# Patient Record
Sex: Female | Born: 1960 | Race: Black or African American | Hispanic: No | Marital: Married | Smoking: Former smoker
Health system: Southern US, Community
[De-identification: ages and names within clinical notes are randomized; demographics above are authoritative.]

## PROBLEM LIST (undated history)

## (undated) DIAGNOSIS — R030 Elevated blood-pressure reading, without diagnosis of hypertension: Secondary | ICD-10-CM

## (undated) DIAGNOSIS — T7840XA Allergy, unspecified, initial encounter: Secondary | ICD-10-CM

## (undated) HISTORY — DX: Elevated blood-pressure reading, without diagnosis of hypertension: R03.0

## (undated) HISTORY — DX: Allergy, unspecified, initial encounter: T78.40XA

---

## 2007-01-13 ENCOUNTER — Ambulatory Visit: Payer: Self-pay | Admitting: Internal Medicine

## 2007-01-13 LAB — CONVERTED CEMR LAB
AST: 43 units/L — ABNORMAL HIGH (ref 0–37)
Albumin: 3.9 g/dL (ref 3.5–5.2)
Basophils Absolute: 0 10*3/uL (ref 0.0–0.1)
Bilirubin, Direct: 0.2 mg/dL (ref 0.0–0.3)
Chloride: 105 meq/L (ref 96–112)
Cholesterol: 148 mg/dL (ref 0–200)
Eosinophils Absolute: 0 10*3/uL (ref 0.0–0.6)
Eosinophils Relative: 1.4 % (ref 0.0–5.0)
GFR calc non Af Amer: 72 mL/min
Glucose, Bld: 82 mg/dL (ref 70–99)
HCT: 38.1 % (ref 36.0–46.0)
HDL: 74.6 mg/dL (ref >39.0)
Hemoglobin, Urine: NEGATIVE
Hemoglobin: 13.1 g/dL (ref 12.0–15.0)
LDL Cholesterol: 69 mg/dL (ref 0–99)
Leukocytes, UA: NEGATIVE
Lymphocytes Relative: 34.5 % (ref 12.0–46.0)
MCHC: 34.3 g/dL (ref 30.0–36.0)
MCV: 88.5 fL (ref 78.0–100.0)
Monocytes Absolute: 0.3 10*3/uL (ref 0.2–0.7)
Neutrophils Relative %: 54.4 % (ref 43.0–77.0)
Nitrite: NEGATIVE
Potassium: 4.6 meq/L (ref 3.5–5.1)
Sodium: 140 meq/L (ref 135–145)
TSH: 0.89 microintl units/mL (ref 0.35–5.50)
Total Bilirubin: 1.5 mg/dL — ABNORMAL HIGH (ref 0.3–1.2)
Urobilinogen, UA: 0.2 (ref 0.0–1.0)
WBC: 3.2 10*3/uL — ABNORMAL LOW (ref 4.5–10.5)

## 2007-01-18 ENCOUNTER — Ambulatory Visit: Payer: Self-pay | Admitting: Internal Medicine

## 2008-09-13 ENCOUNTER — Encounter: Payer: Self-pay | Admitting: Family Medicine

## 2009-11-09 HISTORY — PX: BREAST ENHANCEMENT SURGERY: SHX7

## 2010-03-07 ENCOUNTER — Encounter: Admission: RE | Admit: 2010-03-07 | Discharge: 2010-03-07 | Payer: Self-pay | Admitting: Family Medicine

## 2010-09-15 ENCOUNTER — Ambulatory Visit (HOSPITAL_COMMUNITY): Admission: RE | Admit: 2010-09-15 | Discharge: 2010-09-15 | Payer: Self-pay | Admitting: Obstetrics and Gynecology

## 2011-02-26 ENCOUNTER — Emergency Department (HOSPITAL_COMMUNITY)
Admission: EM | Admit: 2011-02-26 | Discharge: 2011-02-26 | Disposition: A | Discharge status: Home / Self Care | Payer: No Typology Code available for payment source | Attending: Emergency Medicine | Admitting: Emergency Medicine

## 2011-02-26 ENCOUNTER — Emergency Department (HOSPITAL_COMMUNITY): Payer: No Typology Code available for payment source

## 2011-02-26 DIAGNOSIS — M542 Cervicalgia: Secondary | ICD-10-CM | POA: Insufficient documentation

## 2011-02-26 DIAGNOSIS — R079 Chest pain, unspecified: Secondary | ICD-10-CM | POA: Insufficient documentation

## 2011-02-26 DIAGNOSIS — M545 Low back pain, unspecified: Secondary | ICD-10-CM | POA: Insufficient documentation

## 2011-02-26 DIAGNOSIS — T148XXA Other injury of unspecified body region, initial encounter: Secondary | ICD-10-CM | POA: Insufficient documentation

## 2011-02-26 DIAGNOSIS — Y92410 Unspecified street and highway as the place of occurrence of the external cause: Secondary | ICD-10-CM | POA: Insufficient documentation

## 2011-02-26 DIAGNOSIS — R51 Headache: Secondary | ICD-10-CM | POA: Insufficient documentation

## 2011-02-26 DIAGNOSIS — M25519 Pain in unspecified shoulder: Secondary | ICD-10-CM | POA: Insufficient documentation

## 2011-07-27 ENCOUNTER — Telehealth: Payer: Self-pay | Admitting: Internal Medicine

## 2011-07-27 ENCOUNTER — Other Ambulatory Visit: Payer: Self-pay | Admitting: Internal Medicine

## 2011-07-27 DIAGNOSIS — Z1231 Encounter for screening mammogram for malignant neoplasm of breast: Secondary | ICD-10-CM

## 2011-07-27 NOTE — Telephone Encounter (Signed)
Pt stated she has seen dr Lovell Sheehan in past would like to be re-est.

## 2011-07-28 ENCOUNTER — Telehealth: Payer: Self-pay | Admitting: *Deleted

## 2011-07-28 NOTE — Telephone Encounter (Signed)
Not taking any new pt at present-but there other mds in office that will be glad to see her

## 2011-07-30 NOTE — Telephone Encounter (Signed)
Not taking new pt, please ask other md in office

## 2011-07-30 NOTE — Telephone Encounter (Signed)
lmom for pt to call back

## 2011-08-05 NOTE — Telephone Encounter (Signed)
lmom doc jenkins not accepting new pt-other MD in building accepting new pt

## 2011-09-16 ENCOUNTER — Encounter: Payer: Self-pay | Admitting: Internal Medicine

## 2011-09-16 ENCOUNTER — Ambulatory Visit (INDEPENDENT_AMBULATORY_CARE_PROVIDER_SITE_OTHER): Payer: BC Managed Care – PPO | Admitting: Internal Medicine

## 2011-09-16 VITALS — BP 132/84 | HR 76 | Temp 98.7°F | Ht 67.5 in | Wt 143.0 lb

## 2011-09-16 DIAGNOSIS — Z0000 Encounter for general adult medical examination without abnormal findings: Secondary | ICD-10-CM | POA: Insufficient documentation

## 2011-09-16 NOTE — Patient Instructions (Signed)
Please monitor your blood pressure at home 2-3 x per week for next 1 month Call my nurse with your BP readings as directed. Please send [place] a copy of your next mammogram Let us know when you want to be up dated with Tdap and shingles vaccine.

## 2011-09-16 NOTE — Assessment & Plan Note (Signed)
Reviewed adult health maintenance protocols. Patient is followed by her GYN. She is due for her Pap and pelvic and is scheduled for mammogram tomorrow. I requested copy of mammogram. Patient will be referred for screening colonoscopy.  We discussed adult immunizations.  She will check her records for when she last received tetanus vaccine. She will consider zostavax.  Obtain screening labs.

## 2011-09-16 NOTE — Progress Notes (Signed)
  Subjective:    Patient ID: Holly Collins, female    DOB: 08-Sep-1961, 50 y.o.   MRN: [id number]  HPI  50 year old African American female to establish and for routine physical. Patient denies history of chronic medical illness. She reports history of elevated blood pressure readings in the past but has never been treated for hypertension. She is health conscious and exercises on a regular basis. Her hobby is bodybuilding.  She follows healthy diet.    She is followed by her GYN for Pap and Pelvic.  Her menstrual cycles are sporadic.  She denies frequent hot flashes.   Review of Systems  Constitutional: Negative for activity change, appetite change and unexpected weight change.  Eyes: Negative for visual disturbance.  Respiratory: Negative for cough, chest tightness and shortness of breath.   Cardiovascular: Negative for chest pain.  Genitourinary: Negative for difficulty urinating.  Neurological: Negative for headaches.  Gastrointestinal: Negative for abdominal pain, heartburn melena or hematochezia Psych: Negative for depression or anxiety  Past Medical History  Diagnosis Date  . Hypertension     History   Social History  . Marital Status: Married    Spouse Name: N/A    Number of Children: N/A  . Years of Education: N/A   Occupational History  . Not on file.   Social History Main Topics  . Smoking status: Former Smoker    Quit date: 09/15/1989  . Smokeless tobacco: Not on file  . Alcohol Use: No  . Drug Use: No  . Sexually Active: Not on file   Other Topics Concern  . Not on file   Social History Narrative  . No narrative on file    History reviewed. No pertinent past surgical history.  Family History  Problem Relation Age of Onset  . Diabetes Maternal Aunt   . Hypertension Maternal Aunt   . Cancer Maternal Grandmother     stomach  . Stroke Maternal Grandmother     Allergies not on file  No current outpatient prescriptions on file prior to visit.     BP 132/84  Pulse 76  Temp(Src) 98.7 F (37.1 C) (Oral)  Ht 5' 7.5" (1.715 m)  Wt 143 lb (64.864 kg)  BMI 22.07 kg/m2        Objective:   Physical Exam   Constitutional: Appears well-developed and well-nourished. No distress.  Head: Normocephalic and atraumatic.  Ear:  Right and left ear normal.  TMs clear.  Hearing is grossly normal Mouth/Throat: Oropharynx is clear and moist.  Eyes: Conjunctivae are normal. Pupils are equal, round, and reactive to light.  Neck: Normal range of motion. Neck supple. No thyromegaly present. No carotid bruit Cardiovascular: Normal rate, regular rhythm and normal heart sounds.  Exam reveals no gallop and no friction rub.  No murmur heard. Pulmonary/Chest: Effort normal and breath sounds normal.  No wheezes. No rales.  Abdominal: Soft. Bowel sounds are normal. No mass. There is no tenderness.  Neurological: Alert. No cranial nerve deficit.  Skin: Skin is warm and dry.  Psychiatric: Normal mood and affect. Behavior is normal.      Assessment & Plan:

## 2011-09-17 ENCOUNTER — Other Ambulatory Visit: Payer: No Typology Code available for payment source

## 2011-09-17 ENCOUNTER — Ambulatory Visit (HOSPITAL_COMMUNITY)
Admission: RE | Admit: 2011-09-17 | Discharge: 2011-09-17 | Disposition: A | Discharge status: Home / Self Care | Payer: BC Managed Care – PPO | Source: Ambulatory Visit | Attending: Internal Medicine | Admitting: Internal Medicine

## 2011-09-17 DIAGNOSIS — Z1231 Encounter for screening mammogram for malignant neoplasm of breast: Secondary | ICD-10-CM | POA: Insufficient documentation

## 2011-09-17 LAB — LIPID PANEL
HDL: 90.3 mg/dL (ref >39.00)
LDL Cholesterol: 59 mg/dL (ref 0–99)
Total CHOL/HDL Ratio: 2
Triglycerides: 58 mg/dL (ref 0.0–149.0)

## 2011-09-17 LAB — CBC WITH DIFFERENTIAL/PLATELET
Basophils Absolute: 0 10*3/uL (ref 0.0–0.1)
Eosinophils Absolute: 0.2 10*3/uL (ref 0.0–0.7)
Lymphocytes Relative: 34 % (ref 12.0–46.0)
MCHC: 34.3 g/dL (ref 30.0–36.0)
Monocytes Relative: 7.8 % (ref 3.0–12.0)
Neutro Abs: 1.6 10*3/uL (ref 1.4–7.7)
Neutrophils Relative %: 51.9 % (ref 43.0–77.0)
Platelets: 170 10*3/uL (ref 150.0–400.0)
RDW: 12.4 % (ref 11.5–14.6)

## 2011-09-17 LAB — POCT URINALYSIS DIPSTICK
Bilirubin, UA: NEGATIVE
Blood, UA: NEGATIVE
Glucose, UA: NEGATIVE
Leukocytes, UA: NEGATIVE
Nitrite, UA: NEGATIVE
Urobilinogen, UA: 0.2

## 2011-09-17 LAB — HEPATIC FUNCTION PANEL
Alkaline Phosphatase: 49 U/L (ref 39–117)
Bilirubin, Direct: 0 mg/dL (ref 0.0–0.3)
Total Bilirubin: 1.1 mg/dL (ref 0.3–1.2)

## 2011-09-17 LAB — BASIC METABOLIC PANEL
CO2: 33 mEq/L — ABNORMAL HIGH (ref 19–32)
Calcium: 9.3 mg/dL (ref 8.4–10.5)
Chloride: 103 mEq/L (ref 96–112)
Creatinine, Ser: 0.8 mg/dL (ref 0.4–1.2)
Sodium: 140 mEq/L (ref 135–145)

## 2011-09-17 LAB — TSH: TSH: 0.58 u[IU]/mL (ref 0.35–5.50)

## 2011-09-17 NOTE — Progress Notes (Signed)
Addended by: Bonnye Fava on: 09/17/2011 09:04 AM   Modules accepted: Orders

## 2011-09-18 ENCOUNTER — Encounter: Payer: Self-pay | Admitting: Internal Medicine

## 2011-09-21 ENCOUNTER — Encounter: Payer: Self-pay | Admitting: Internal Medicine

## 2011-10-21 ENCOUNTER — Encounter: Payer: Self-pay | Admitting: Gastroenterology

## 2011-11-16 ENCOUNTER — Encounter: Payer: Self-pay | Admitting: Gastroenterology

## 2011-12-02 ENCOUNTER — Other Ambulatory Visit: Payer: No Typology Code available for payment source | Admitting: Gastroenterology

## 2012-01-08 DIAGNOSIS — T7840XA Allergy, unspecified, initial encounter: Secondary | ICD-10-CM

## 2012-01-08 HISTORY — DX: Allergy, unspecified, initial encounter: T78.40XA

## 2012-08-16 ENCOUNTER — Other Ambulatory Visit: Payer: Self-pay | Admitting: Internal Medicine

## 2012-08-16 DIAGNOSIS — Z1231 Encounter for screening mammogram for malignant neoplasm of breast: Secondary | ICD-10-CM

## 2012-10-28 ENCOUNTER — Ambulatory Visit (HOSPITAL_COMMUNITY)
Admission: RE | Admit: 2012-10-28 | Discharge: 2012-10-28 | Disposition: A | Discharge status: Home / Self Care | Payer: BC Managed Care – PPO | Source: Ambulatory Visit | Attending: Internal Medicine | Admitting: Internal Medicine

## 2012-10-28 DIAGNOSIS — Z1231 Encounter for screening mammogram for malignant neoplasm of breast: Secondary | ICD-10-CM | POA: Insufficient documentation
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
## 2012-11-23 ENCOUNTER — Ambulatory Visit (INDEPENDENT_AMBULATORY_CARE_PROVIDER_SITE_OTHER): Payer: BC Managed Care – PPO | Admitting: Internal Medicine

## 2012-11-23 ENCOUNTER — Encounter: Payer: Self-pay | Admitting: Internal Medicine

## 2012-11-23 ENCOUNTER — Other Ambulatory Visit (HOSPITAL_COMMUNITY)
Admission: RE | Admit: 2012-11-23 | Discharge: 2012-11-23 | Disposition: A | Discharge status: Home / Self Care | Payer: BC Managed Care – PPO | Source: Ambulatory Visit | Attending: Internal Medicine | Admitting: Internal Medicine

## 2012-11-23 VITALS — BP 148/98 | HR 72 | Temp 98.1°F | Ht 68.0 in | Wt 150.0 lb

## 2012-11-23 DIAGNOSIS — Z23 Encounter for immunization: Secondary | ICD-10-CM

## 2012-11-23 DIAGNOSIS — Z01419 Encounter for gynecological examination (general) (routine) without abnormal findings: Secondary | ICD-10-CM | POA: Insufficient documentation

## 2012-11-23 DIAGNOSIS — I10 Essential (primary) hypertension: Secondary | ICD-10-CM

## 2012-11-23 DIAGNOSIS — Z0000 Encounter for general adult medical examination without abnormal findings: Secondary | ICD-10-CM

## 2012-11-23 LAB — HEPATIC FUNCTION PANEL
AST: 33 U/L (ref 0–37)
Albumin: 4.4 g/dL (ref 3.5–5.2)
Alkaline Phosphatase: 66 U/L (ref 39–117)
Total Protein: 7.3 g/dL (ref 6.0–8.3)

## 2012-11-23 LAB — CBC WITH DIFFERENTIAL/PLATELET
Basophils Absolute: 0.1 10*3/uL (ref 0.0–0.1)
Basophils Relative: 1.1 % (ref 0.0–3.0)
Eosinophils Absolute: 0.1 10*3/uL (ref 0.0–0.7)
Lymphocytes Relative: 23 % (ref 12.0–46.0)
MCHC: 34.1 g/dL (ref 30.0–36.0)
Monocytes Relative: 8.2 % (ref 3.0–12.0)
Neutrophils Relative %: 65.7 % (ref 43.0–77.0)
RBC: 4.49 Mil/uL (ref 3.87–5.11)

## 2012-11-23 LAB — LIPID PANEL
Cholesterol: 159 mg/dL (ref 0–200)
HDL: 90 mg/dL (ref >39.00)
LDL Cholesterol: 62 mg/dL (ref 0–99)
Triglycerides: 34 mg/dL (ref 0.0–149.0)

## 2012-11-23 LAB — POCT URINALYSIS DIPSTICK
Blood, UA: NEGATIVE
Nitrite, UA: NEGATIVE
Urobilinogen, UA: 0.2
pH, UA: 6.5

## 2012-11-23 MED ORDER — LOSARTAN POTASSIUM 50 MG PO TABS: 50.0000 mg | ORAL_TABLET | Freq: Every day | ORAL | Status: DC

## 2012-11-23 NOTE — Progress Notes (Signed)
Subjective:    Patient ID: DOAA KENDZIERSKI, female    DOB: [birth year], 52 y.o.   MRN: [id number]  HPI  52 year old [place] American female for followup and routine physical. She denies any significant interval medical history. She reports completing mammogram in December of 2013. Patient never followed up for colonoscopy last year.  Patient is due for annual Pap and pelvic. She denies history of abnormal Paps in the past. She denies any vaginal discharge. She has stopped menstruating. She has mild intermittent hot flashes.  Patient follows healthy diet and exercises regularly. She is a Pharmacist, community.  Review of Systems  Constitutional: Negative for activity change, appetite change and unexpected weight change.  Eyes: Negative for visual disturbance.  Respiratory: Negative for cough, chest tightness and shortness of breath.   Cardiovascular: Negative for chest pain.  Genitourinary: Negative for difficulty urinating.  Neurological: Negative for headaches.  Gastrointestinal: Negative for abdominal pain, heartburn melena or hematochezia Psych: Negative for depression or anxiety  Past Medical History  Diagnosis Date  . Elevated blood pressure reading without diagnosis of hypertension     History   Social History  . Marital Status: Married    Spouse Name: N/A    Number of Children: N/A  . Years of Education: N/A   Occupational History  . Customer service    Social History Main Topics  . Smoking status: Former Smoker    Quit date: 09/15/1989  . Smokeless tobacco: Not on file  . Alcohol Use: No  . Drug Use: No  . Sexually Active: Not on file   Other Topics Concern  . 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    No past surgical history on file.  Family History  Problem Relation Age of Onset  . Diabetes Maternal Aunt     . Hypertension Maternal Aunt   . Cancer Maternal Grandmother     stomach  . Stroke Maternal Grandmother     Not on File  Current Outpatient Prescriptions on File Prior to Visit  Medication Sig Dispense Refill  . losartan (COZAAR) 50 MG tablet Take 1 tablet (50 mg total) by mouth daily.  30 tablet  2    BP 148/98  Pulse 72  Temp 98.1 F (36.7 C) (Oral)  Ht 5\' 8"  (1.727 m)  Wt 150 lb (68.04 kg)  BMI 22.81 kg/m2  LMP 08/10/2011           Objective:   Physical Exam  Constitutional: She is oriented to person, place, and time. She appears well-developed and well-nourished. No distress.  HENT:  Head: Normocephalic.  Right Ear: External ear normal.  Left Ear: External ear normal.  Mouth/Throat: Oropharynx is clear and moist.  Eyes: EOM are normal. Pupils are equal, round, and reactive to light.  Neck: Neck supple. No thyromegaly present.       No carotid bruit  Cardiovascular: Normal rate, regular rhythm and normal heart sounds.   No murmur heard. Pulmonary/Chest: Effort normal and breath sounds normal. She has no wheezes.  Abdominal: Soft. Bowel sounds are normal. She exhibits no mass.  Genitourinary: Vagina normal and uterus normal. Guaiac negative stool. No vaginal discharge found.  Musculoskeletal: She exhibits no edema.  Lymphadenopathy:    She has no cervical adenopathy.  Neurological: She is alert and oriented to person, place, and time.  No cranial nerve deficit.  Skin: Skin is warm and dry.  Psychiatric: She has a normal mood and affect. Her behavior is normal.          Assessment & Plan:

## 2012-11-23 NOTE — Assessment & Plan Note (Addendum)
Reviewed adult health maintenance protocols.  Pap and pelvic exam performed. No abnormality in physical exam. Patient updated with tetanus vaccine. Refer for screening colonoscopy. She declines influenza vaccine.  Patient follows healthy diet and exercises regularly. She is a Pharmacist, community. 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

## 2012-11-23 NOTE — Patient Instructions (Addendum)
Please complete the following lab tests within 2 weeks of starting blood pressure medication: BMET - 401.9

## 2012-11-23 NOTE — Assessment & Plan Note (Signed)
Discussed risks of untreated hypertension. Patient reluctant to start medication. She woul like to monitor home blood pressure readings. Patient understands to start losartan 50 mg once daily if systolic blood pressures consistently above 140. She'll return in 2 weeks for electrolytes and kidney function after starting losartan.   ROV in 6 weeks. BP: 148/98 mmHg

## 2012-12-05 ENCOUNTER — Encounter: Payer: Self-pay | Admitting: Internal Medicine

## 2013-01-06 ENCOUNTER — Encounter: Payer: Self-pay | Admitting: Internal Medicine

## 2013-01-06 ENCOUNTER — Ambulatory Visit (AMBULATORY_SURGERY_CENTER): Payer: BC Managed Care – PPO | Admitting: *Deleted

## 2013-01-06 VITALS — Ht 68.0 in | Wt 150.0 lb

## 2013-01-06 MED ORDER — MOVIPREP 100 G PO SOLR: ORAL | Status: DC

## 2013-01-12 ENCOUNTER — Encounter: Payer: BC Managed Care – PPO | Admitting: Internal Medicine

## 2013-01-24 ENCOUNTER — Ambulatory Visit (AMBULATORY_SURGERY_CENTER): Payer: BC Managed Care – PPO | Admitting: Internal Medicine

## 2013-01-24 ENCOUNTER — Encounter: Payer: Self-pay | Admitting: Internal Medicine

## 2013-01-24 VITALS — BP 142/88 | HR 53 | Temp 98.1°F | Resp 13 | Ht 68.0 in | Wt 150.0 lb

## 2013-01-24 DIAGNOSIS — Z1211 Encounter for screening for malignant neoplasm of colon: Secondary | ICD-10-CM

## 2013-01-24 MED ORDER — SODIUM CHLORIDE 0.9 % IV SOLN: 500.0000 mL | INTRAVENOUS | Status: DC

## 2013-01-24 NOTE — Progress Notes (Signed)
Patient did not experience any of the following events: a burn prior to discharge; a fall within the facility; wrong site/side/patient/procedure/implant event; or a hospital transfer or hospital admission upon discharge from the facility. (G8907) Patient did not have preoperative order for IV antibiotic SSI prophylaxis. (G8918)  

## 2013-01-24 NOTE — Op Note (Signed)
[facility] [address] [place], [address]   COLONOSCOPY PROCEDURE REPORT  PATIENT: Holly Collins, Holly Collins  MR#: [id number] BIRTHDATE: 1961/01/07 , 51  yrs. old GENDER: Female ENDOSCOPIST: Roxy Cedar, MD REFERRED XB:[id number] Artist Pais, DO PROCEDURE DATE:  01/24/2013 PROCEDURE:   Colonoscopy, screening ASA CLASS:   Class I INDICATIONS:average risk screening. MEDICATIONS: Fentanyl 75 mcg IV, Versed 5 mg IV, and Benadryl 25  mg IV  DESCRIPTION OF PROCEDURE:   After the risks benefits and alternatives of the procedure were thoroughly explained, informed consent was obtained.  A digital rectal exam revealed no abnormalities of the rectum.   The LB [id number]-[id number]  endoscope was introduced through the anus and advanced to the cecum, which was identified by both the appendix and ileocecal valve. No adverse events experienced.   The quality of the prep was good, using MoviPrep  The instrument was then slowly withdrawn as the colon was fully examined.      COLON FINDINGS: Mild diverticulosis was noted in the ascending colon and sigmoid colon.   The colon was otherwise normal.  There was no inflammation, polyps or cancers .  Retroflexed views revealed no abnormalities. The time to cecum=4 minutes 12 seconds.  Withdrawal time=9 minutes 30 seconds.  The scope was withdrawn and the procedure completed.  COMPLICATIONS: There were no complications.  ENDOSCOPIC IMPRESSION: 1.   Mild diverticulosis was noted in the ascending and sigmoid colon 2.   The colon was otherwise normal  RECOMMENDATIONS: 1. Continue current colorectal screening recommendations for "routine risk" patients with a repeat colonoscopy in 10 years.   eSigned:  Roxy Cedar, MD 01/24/2013 12:00 PM   cc: Thomos Lemons, DO and The Patient

## 2013-01-24 NOTE — Patient Instructions (Addendum)
YOU HAD AN ENDOSCOPIC PROCEDURE TODAY AT THE [facility]: Refer to the procedure report that was given to you for any specific questions about what was found during the examination.  If the procedure report does not answer your questions, please call your gastroenterologist to clarify.  If you requested that your care partner not be given the details of your procedure findings, then the procedure report has been included in a sealed envelope for you to review at your convenience later.  YOU SHOULD EXPECT: Some feelings of bloating in the abdomen. Passage of more gas than usual.  Walking can help get rid of the air that was put into your GI tract during the procedure and reduce the bloating. If you had a lower endoscopy (such as a colonoscopy or flexible sigmoidoscopy) you may notice spotting of blood in your stool or on the toilet paper. If you underwent a bowel prep for your procedure, then you may not have a normal bowel movement for a few days.  DIET: Your first meal following the procedure should be a light meal and then it is ok to progress to your normal diet.  A half-sandwich or bowl of soup is an example of a good first meal.  Heavy or fried foods are harder to digest and may make you feel nauseous or bloated.  Likewise meals heavy in dairy and vegetables can cause extra gas to form and this can also increase the bloating.  Drink plenty of fluids but you should avoid alcoholic beverages for 24 hours.  ACTIVITY: Your care partner should take you home directly after the procedure.  You should plan to take it easy, moving slowly for the rest of the day.  You can resume normal activity the day after the procedure however you should NOT DRIVE or use heavy machinery for 24 hours (because of the sedation medicines used during the test).    SYMPTOMS TO REPORT IMMEDIATELY: A gastroenterologist can be reached at any hour.  During normal business hours, 8:30 AM to 5:00 PM Monday through Friday,  call [phone].  After hours and on weekends, please call the GI answering service at [phone] who will take a message and have the physician on call contact you.   Following lower endoscopy (colonoscopy or flexible sigmoidoscopy):  Excessive amounts of blood in the stool  Significant tenderness or worsening of abdominal pains  Swelling of the abdomen that is new, acute  Fever of 100F or higher  FOLLOW UP: If any biopsies were taken you will be contacted by phone or by letter within the next 1-3 weeks.  Call your gastroenterologist if you have not heard about the biopsies in 3 weeks.  Our staff will call the home number listed on your records the next business day following your procedure to check on you and address any questions or concerns that you may have at that time regarding the information given to you following your procedure. This is a courtesy call and so if there is no answer at the home number and we have not heard from you through the emergency physician on call, we will assume that you have returned to your regular daily activities without incident.  SIGNATURES/CONFIDENTIALITY: You and/or your care partner have signed paperwork which will be entered into your electronic medical record.  These signatures attest to the fact that that the information above on your After Visit Summary has been reviewed and is understood.  Full responsibility of the confidentiality of this   discharge information lies with you and/or your care-partner.  Resume medications. Information given on diverticulosis and high fiber diet with discharge instructions. 

## 2013-01-24 NOTE — Progress Notes (Signed)
1237 pt. Has not expelled air pt. Ambulated to [facility]. And spouse with pt.,she denies pain ,abdomen soft.pt. Expelled air prior to discharge.

## 2013-01-25 ENCOUNTER — Telehealth: Payer: Self-pay

## 2013-01-25 NOTE — Telephone Encounter (Signed)
Left message on answering machine. 

## 2013-08-21 ENCOUNTER — Other Ambulatory Visit: Payer: Self-pay | Admitting: Internal Medicine

## 2013-08-21 DIAGNOSIS — Z1231 Encounter for screening mammogram for malignant neoplasm of breast: Secondary | ICD-10-CM

## 2013-09-14 ENCOUNTER — Other Ambulatory Visit: Payer: Self-pay

## 2013-10-30 ENCOUNTER — Ambulatory Visit (HOSPITAL_COMMUNITY)
Admission: RE | Admit: 2013-10-30 | Discharge: 2013-10-30 | Disposition: A | Discharge status: Home / Self Care | Payer: BC Managed Care – PPO | Source: Ambulatory Visit | Attending: Internal Medicine | Admitting: Internal Medicine

## 2013-10-30 DIAGNOSIS — Z1231 Encounter for screening mammogram for malignant neoplasm of breast: Secondary | ICD-10-CM

## 2013-11-13 ENCOUNTER — Other Ambulatory Visit (INDEPENDENT_AMBULATORY_CARE_PROVIDER_SITE_OTHER): Payer: BC Managed Care – PPO

## 2013-11-13 DIAGNOSIS — Z0000 Encounter for general adult medical examination without abnormal findings: Secondary | ICD-10-CM

## 2013-11-13 LAB — POCT URINALYSIS DIPSTICK
BILIRUBIN UA: NEGATIVE
Blood, UA: NEGATIVE
GLUCOSE UA: NEGATIVE
KETONES UA: NEGATIVE
Nitrite, UA: NEGATIVE
SPEC GRAV UA: 1.025
UROBILINOGEN UA: 0.2
pH, UA: 6

## 2013-11-13 LAB — CBC WITH DIFFERENTIAL/PLATELET
BASOS ABS: 0.1 10*3/uL (ref 0.0–0.1)
Basophils Relative: 1 % (ref 0.0–3.0)
Eosinophils Absolute: 0.2 10*3/uL (ref 0.0–0.7)
Eosinophils Relative: 3.2 % (ref 0.0–5.0)
HCT: 41.2 % (ref 36.0–46.0)
Hemoglobin: 14.1 g/dL (ref 12.0–15.0)
LYMPHS PCT: 23.4 % (ref 12.0–46.0)
Lymphs Abs: 1.2 10*3/uL (ref 0.7–4.0)
MCHC: 34.2 g/dL (ref 30.0–36.0)
MCV: 88.9 fl (ref 78.0–100.0)
Monocytes Absolute: 0.3 10*3/uL (ref 0.1–1.0)
Monocytes Relative: 6.1 % (ref 3.0–12.0)
NEUTROS PCT: 66.3 % (ref 43.0–77.0)
Neutro Abs: 3.4 10*3/uL (ref 1.4–7.7)
Platelets: 177 10*3/uL (ref 150.0–400.0)
RBC: 4.63 Mil/uL (ref 3.87–5.11)
RDW: 12.3 % (ref 11.5–14.6)
WBC: 5.1 10*3/uL (ref 4.5–10.5)

## 2013-11-13 LAB — BASIC METABOLIC PANEL
BUN: 17 mg/dL (ref 6–23)
CALCIUM: 9.6 mg/dL (ref 8.4–10.5)
CO2: 30 meq/L (ref 19–32)
CREATININE: 0.8 mg/dL (ref 0.4–1.2)
Chloride: 101 mEq/L (ref 96–112)
GFR: 102.71 mL/min (ref >60.00)
GLUCOSE: 85 mg/dL (ref 70–99)
Potassium: 4 mEq/L (ref 3.5–5.1)
Sodium: 137 mEq/L (ref 135–145)

## 2013-11-13 LAB — LIPID PANEL
Cholesterol: 192 mg/dL (ref 0–200)
HDL: 94.8 mg/dL (ref >39.00)
LDL Cholesterol: 90 mg/dL (ref 0–99)
Total CHOL/HDL Ratio: 2
Triglycerides: 38 mg/dL (ref 0.0–149.0)
VLDL: 7.6 mg/dL (ref 0.0–40.0)

## 2013-11-13 LAB — HEPATIC FUNCTION PANEL
ALBUMIN: 4.4 g/dL (ref 3.5–5.2)
ALK PHOS: 63 U/L (ref 39–117)
ALT: 28 U/L (ref 0–35)
AST: 29 U/L (ref 0–37)
BILIRUBIN DIRECT: 0.2 mg/dL (ref 0.0–0.3)
Total Bilirubin: 1.3 mg/dL — ABNORMAL HIGH (ref 0.3–1.2)
Total Protein: 7.6 g/dL (ref 6.0–8.3)

## 2013-11-13 LAB — TSH: TSH: 0.79 u[IU]/mL (ref 0.35–5.50)

## 2013-11-20 ENCOUNTER — Ambulatory Visit (INDEPENDENT_AMBULATORY_CARE_PROVIDER_SITE_OTHER): Payer: BC Managed Care – PPO | Admitting: Internal Medicine

## 2013-11-20 ENCOUNTER — Encounter: Payer: Self-pay | Admitting: Internal Medicine

## 2013-11-20 VITALS — BP 134/98 | HR 64 | Temp 98.3°F | Ht 67.5 in | Wt 146.0 lb

## 2013-11-20 DIAGNOSIS — Z0000 Encounter for general adult medical examination without abnormal findings: Secondary | ICD-10-CM

## 2013-11-20 MED ORDER — LOSARTAN POTASSIUM 50 MG PO TABS: 50.0000 mg | ORAL_TABLET | Freq: Every day | ORAL | Status: DC

## 2013-11-20 NOTE — Progress Notes (Signed)
Subjective:    Patient ID: Holly Collins, female    DOB: 12/14/1960, 53 y.o.   MRN: [id number]  HPI  53 year old African American female with history of elevated blood pressure without diagnosis of hypertension for routine physical. She denies any significant interval medical history. 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 She exercises on a regular basis. She follows healthy diet.  She is up-to-date on her colonoscopy. She was given tetanus vaccine last year. She is due for screening mammogram. She is followed by her gynecologist.   Review of Systems  Constitutional: Negative for activity change, appetite change and unexpected weight change.  Eyes: Negative for visual disturbance.  Respiratory: Negative for cough, chest tightness and shortness of breath.   Cardiovascular: Negative for chest pain.  Genitourinary: Negative for difficulty urinating.  Neurological: Negative for headaches.  Gastrointestinal: Negative for abdominal pain, heartburn melena or hematochezia Psych: Negative for depression or anxiety Endo:  No polyuria or polydypsia        Past Medical History  Diagnosis Date  . Elevated blood pressure reading without diagnosis of hypertension   . Allergy 01/08/12    soy-face breaks in a rash    History   Social History  . Marital Status: Married    Spouse Name: N/A    Number of Children: N/A  . 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   Social History Main Topics  . Smoking status: Former Smoker    Quit date: 09/15/1989  . Smokeless tobacco: Never Used  . Alcohol Use: No  . Drug Use: No  . Sexual Activity: Not on file   Other Topics Concern  . Not on file   Social History Narrative   [place]   She works in Clinical biochemistCustomer service   She was a social smoker but quit 22 years ago.   She has 1 son and 3  stepsons.    Past Surgical History  Procedure Laterality Date  . Breast enhancement surgery  2011    Family History  Problem Relation Age of Onset  . Diabetes Maternal Aunt   . Hypertension Maternal Aunt   . Cancer Maternal Grandmother     stomach  . Stroke Maternal Grandmother   . Stomach cancer Mother 2067  . Colon cancer Neg Hx     Allergies  Allergen Reactions  . Soy Allergy Rash    No current outpatient prescriptions on file prior to visit.   No current facility-administered medications on file prior to visit.    BP 134/98  Pulse 64  Temp(Src) 98.3 F (36.8 C) (Oral)  Ht 5' 7.5" (1.715 m)  Wt 146 lb (66.225 kg)  BMI 22.52 kg/m2  LMP 08/10/2011     Objective:   Physical Exam  Constitutional: She is oriented to person, place, and time. She appears well-developed and well-nourished. No distress.  HENT:  Head: Normocephalic and atraumatic.  Right Ear: External ear normal.  Left Ear: External ear normal.  Mouth/Throat: Oropharynx is clear and moist.  Eyes: Conjunctivae and EOM are normal. Pupils are equal, round, and reactive to light.  Neck: Neck supple. No thyromegaly present.  No carotid bruit  Cardiovascular: Normal rate, regular rhythm and normal heart sounds.   No murmur heard. Pulmonary/Chest: Effort normal and breath sounds normal. She has no wheezes.  Abdominal: Soft. Bowel sounds are normal. There  is no tenderness.  Musculoskeletal: She exhibits no edema.  Lymphadenopathy:    She has no cervical adenopathy.  Neurological: She is alert and oriented to person, place, and time. No cranial nerve deficit.  Skin: Skin is warm and dry.  Psychiatric: She has a normal mood and affect. Her behavior is normal.          Assessment & Plan:

## 2013-11-20 NOTE — Patient Instructions (Signed)
Monitor your blood pressure at home. If you start Losartan 50 mg, return in 4 weeks for BMET and follow up visit. As your insurance company about Zostavax (shingles vaccine)

## 2013-11-20 NOTE — Progress Notes (Signed)
[employer] report did not print 

## 2013-11-20 NOTE — Assessment & Plan Note (Signed)
Reviewed adult health maintenance protocols. Patient declines influenza vaccine. Screening labs reviewed in detail. Her lipid panel is within acceptable limits. Patient has pre-hypertension. I recommended she start losartan 50 mg. She will consider starting blood pressure medication on her own. She understands she will need followup BMET and office visit should she decide to start medication.

## 2014-09-24 ENCOUNTER — Other Ambulatory Visit: Payer: Self-pay | Admitting: Internal Medicine

## 2014-09-24 DIAGNOSIS — Z1231 Encounter for screening mammogram for malignant neoplasm of breast: Secondary | ICD-10-CM

## 2014-11-01 ENCOUNTER — Ambulatory Visit (HOSPITAL_COMMUNITY)
Admission: RE | Admit: 2014-11-01 | Discharge: 2014-11-01 | Disposition: A | Discharge status: Home / Self Care | Payer: BC Managed Care – PPO | Source: Ambulatory Visit | Attending: Internal Medicine | Admitting: Internal Medicine

## 2014-11-01 DIAGNOSIS — Z1231 Encounter for screening mammogram for malignant neoplasm of breast: Secondary | ICD-10-CM | POA: Insufficient documentation

## 2014-11-14 ENCOUNTER — Other Ambulatory Visit (INDEPENDENT_AMBULATORY_CARE_PROVIDER_SITE_OTHER): Payer: BC Managed Care – PPO

## 2014-11-14 DIAGNOSIS — Z0000 Encounter for general adult medical examination without abnormal findings: Secondary | ICD-10-CM

## 2014-11-14 LAB — CBC WITH DIFFERENTIAL/PLATELET
Basophils Absolute: 0.1 10*3/uL (ref 0.0–0.1)
Basophils Relative: 1 % (ref 0.0–3.0)
EOS PCT: 2.4 % (ref 0.0–5.0)
Eosinophils Absolute: 0.1 10*3/uL (ref 0.0–0.7)
HCT: 41 % (ref 36.0–46.0)
Hemoglobin: 13.6 g/dL (ref 12.0–15.0)
LYMPHS PCT: 26.8 % (ref 12.0–46.0)
Lymphs Abs: 1.5 10*3/uL (ref 0.7–4.0)
MCHC: 33.3 g/dL (ref 30.0–36.0)
MCV: 89.8 fl (ref 78.0–100.0)
MONOS PCT: 7.3 % (ref 3.0–12.0)
Monocytes Absolute: 0.4 10*3/uL (ref 0.1–1.0)
NEUTROS ABS: 3.5 10*3/uL (ref 1.4–7.7)
NEUTROS PCT: 62.5 % (ref 43.0–77.0)
Platelets: 204 10*3/uL (ref 150.0–400.0)
RBC: 4.57 Mil/uL (ref 3.87–5.11)
RDW: 12.6 % (ref 11.5–15.5)
WBC: 5.7 10*3/uL (ref 4.0–10.5)

## 2014-11-14 LAB — BASIC METABOLIC PANEL
BUN: 30 mg/dL — AB (ref 6–23)
CALCIUM: 9.5 mg/dL (ref 8.4–10.5)
CO2: 28 mEq/L (ref 19–32)
CREATININE: 0.9 mg/dL (ref 0.4–1.2)
Chloride: 99 mEq/L (ref 96–112)
GFR: 86.39 mL/min (ref >60.00)
GLUCOSE: 87 mg/dL (ref 70–99)
POTASSIUM: 3.8 meq/L (ref 3.5–5.1)
SODIUM: 134 meq/L — AB (ref 135–145)

## 2014-11-14 LAB — POCT URINALYSIS DIPSTICK
Bilirubin, UA: NEGATIVE
Blood, UA: NEGATIVE
Glucose, UA: NEGATIVE
Ketones, UA: NEGATIVE
NITRITE UA: NEGATIVE
PROTEIN UA: NEGATIVE
Spec Grav, UA: <=1.005
UROBILINOGEN UA: 0.2
pH, UA: 6

## 2014-11-14 LAB — LIPID PANEL
Cholesterol: 188 mg/dL (ref 0–200)
HDL: 85.4 mg/dL (ref >39.00)
LDL Cholesterol: 94 mg/dL (ref 0–99)
NonHDL: 102.6
Total CHOL/HDL Ratio: 2
Triglycerides: 44 mg/dL (ref 0.0–149.0)
VLDL: 8.8 mg/dL (ref 0.0–40.0)

## 2014-11-14 LAB — HEPATIC FUNCTION PANEL
ALK PHOS: 67 U/L (ref 39–117)
ALT: 24 U/L (ref 0–35)
AST: 31 U/L (ref 0–37)
Albumin: 4.6 g/dL (ref 3.5–5.2)
BILIRUBIN DIRECT: 0 mg/dL (ref 0.0–0.3)
Total Bilirubin: 0.9 mg/dL (ref 0.2–1.2)
Total Protein: 7.4 g/dL (ref 6.0–8.3)

## 2014-11-14 LAB — TSH: TSH: 0.85 u[IU]/mL (ref 0.35–4.50)

## 2014-11-21 ENCOUNTER — Ambulatory Visit (INDEPENDENT_AMBULATORY_CARE_PROVIDER_SITE_OTHER): Payer: BLUE CROSS/BLUE SHIELD | Admitting: Family Medicine

## 2014-11-21 ENCOUNTER — Encounter: Payer: BC Managed Care – PPO | Admitting: Internal Medicine

## 2014-11-21 ENCOUNTER — Encounter: Payer: Self-pay | Admitting: Family Medicine

## 2014-11-21 VITALS — BP 130/80 | HR 70 | Temp 98.2°F | Ht 67.5 in | Wt 143.0 lb

## 2014-11-21 DIAGNOSIS — Z0000 Encounter for general adult medical examination without abnormal findings: Secondary | ICD-10-CM

## 2014-11-21 NOTE — Progress Notes (Signed)
   Subjective:    Patient ID: Holly Collins, female    DOB: 11/20/1960, 54 y.o.   MRN: [id number]  HPI Patient seen for complete physical. 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 Very fit. Prior history of hypertension. Controlled by her home readings without medication. Previously took losartan. She had colonoscopy couple years ago. She declines flu vaccine. Tetanus up-to-date. Pap smear normal and January 2014 and she prefers to go to every 3 years and is low risk. Recent mammogram normal  Family history reviewed. Father died of ALS complications. Mother had gastric cancer. Patient is never smoked.  Reviewed without change:  Past Medical History  Diagnosis Date  . Elevated blood pressure reading without diagnosis of hypertension   . Allergy 01/08/12    soy-face breaks in a rash   Past Surgical History  Procedure Laterality Date  . Breast enhancement surgery  2011    reports that she quit smoking about 25 years ago. She has never used smokeless tobacco. She reports that she does not drink alcohol or use illicit drugs. family history includes Cancer in her maternal grandmother; Diabetes in her maternal aunt; Hypertension in her maternal aunt; Stomach cancer (age of onset: 6567) in her mother; Stroke in her maternal grandmother. There is no history of Colon cancer. Allergies  Allergen Reactions  . Soy Allergy Rash      Review of Systems  Constitutional: Negative for fever, activity change, appetite change, fatigue and unexpected weight change.  HENT: Negative for ear pain, hearing loss, sore throat and trouble swallowing.   Eyes: Negative for visual disturbance.  Respiratory: Negative for cough and shortness of breath.   Cardiovascular: Negative for chest pain and palpitations.  Gastrointestinal: Negative for abdominal pain, diarrhea, constipation and blood in stool.  Endocrine: Negative for polydipsia and polyuria.  Genitourinary: Negative for dysuria  and hematuria.  Musculoskeletal: Negative for myalgias, back pain and arthralgias.  Skin: Negative for rash.  Neurological: Negative for dizziness, syncope and headaches.  Hematological: Negative for adenopathy.  Psychiatric/Behavioral: Negative for confusion and dysphoric mood.       Objective:   Physical Exam  Constitutional: She is oriented to person, place, and time. She appears well-developed and well-nourished.  HENT:  Head: Normocephalic and atraumatic.  Eyes: EOM are normal. Pupils are equal, round, and reactive to light.  Neck: Normal range of motion. Neck supple. No thyromegaly present.  Cardiovascular: Normal rate, regular rhythm and normal heart sounds.   No murmur heard. Pulmonary/Chest: Breath sounds normal. No respiratory distress. She has no wheezes. She has no rales.  Abdominal: Soft. Bowel sounds are normal. She exhibits no distension and no mass. There is no tenderness. There is no rebound and no guarding.  Musculoskeletal: Normal range of motion. She exhibits no edema.  Lymphadenopathy:    She has no cervical adenopathy.  Neurological: She is alert and oriented to person, place, and time. She displays normal reflexes. No cranial nerve deficit.  Skin: No rash noted.  Psychiatric: She has a normal mood and affect. Her behavior is normal. Judgment and thought content normal.          Assessment & Plan:  Complete physical. Labs reviewed with no concerns. She is very fit and exercises couple hours per day. After discussing options we have elected every 3 year Pap smear as she is low risk. Colonoscopy up-to-date. Tetanus up-to-date. Flu vaccine recommended and declined

## 2014-11-21 NOTE — Progress Notes (Signed)
Pre visit review using our clinic review tool, if applicable. No additional management support is needed unless otherwise documented below in the visit note. 

## 2015-10-09 ENCOUNTER — Other Ambulatory Visit: Payer: Self-pay

## 2015-10-09 DIAGNOSIS — Z1231 Encounter for screening mammogram for malignant neoplasm of breast: Secondary | ICD-10-CM

## 2015-11-08 ENCOUNTER — Ambulatory Visit (INDEPENDENT_AMBULATORY_CARE_PROVIDER_SITE_OTHER): Payer: BLUE CROSS/BLUE SHIELD | Admitting: Family Medicine

## 2015-11-08 VITALS — BP 140/98 | HR 72 | Temp 98.6°F | Resp 14 | Ht 67.75 in | Wt 152.0 lb

## 2015-11-08 DIAGNOSIS — L03012 Cellulitis of left finger: Secondary | ICD-10-CM

## 2015-11-08 DIAGNOSIS — M79645 Pain in left finger(s): Secondary | ICD-10-CM | POA: Diagnosis not present

## 2015-11-08 DIAGNOSIS — M7989 Other specified soft tissue disorders: Secondary | ICD-10-CM

## 2015-11-08 MED ORDER — CEPHALEXIN 500 MG PO CAPS: 500.0000 mg | ORAL_CAPSULE | Freq: Three times a day (TID) | ORAL | Status: DC

## 2015-11-08 NOTE — Progress Notes (Signed)
Risk and benefits discussed and verbal consent obtained. Anesthetic allergies reviewed. Patient anesthetized using 1:1 mix of 2% lidocaine without epi. A 1 cm incision was made using a number 11 blade and purulent material was expressed.  The was not wound packed. The patient tolerated the procedure without difficulty.   A clean dressing was placed and wound care instructions were provided.  Deliah BostonMichael Taejon Irani, MS, PA-C 10:39 AM, 11/08/2015

## 2015-11-08 NOTE — Progress Notes (Signed)
 Chief Complaint:  Chief Complaint  Patient presents with  . Thumb pain    left hand x 3 days    HPI: [name] is a 54 y.o. female who [facility] complaining of 3 day hx of left thumb pain with swelling and pain,  Throbbing pain. She has pain with ROM.  She thinks this all started after she pushed and clipped some of her cuticles a few days after she ahd a manicure since she did not think they got all of her cuticles removed She is  left hand dominant, no numbness or tingling, no weakness, no hx of gout, no hx of arthritis.No prior hx of this. Denies fevers or chills.   Past Medical History  Diagnosis Date  . Elevated blood pressure reading without diagnosis of hypertension   . Allergy 01/08/12    soy-face breaks in a rash   Past Surgical History  Procedure Laterality Date  . Breast enhancement surgery  2011   Social History   Social History  . Marital Status: Married    Spouse Name: N/A  . Number of Children: N/A  . 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   Social History Main Topics  . Smoking status: Former Smoker    Quit date: 09/15/1989  . Smokeless tobacco: Never Used  . Alcohol Use: No  . Drug Use: No  . Sexual Activity: Not Asked   Other Topics Concern  . None   Social History Narrative   [place]   She works in Clinical biochemistCustomer service   She was a social smoker but quit 22 years ago.   She has 1 son and 3 stepsons.   Family History  Problem Relation Age of Onset  . Diabetes Maternal Aunt   . Hypertension Maternal Aunt   . Cancer Maternal Grandmother     stomach  . Stroke Maternal Grandmother   . Stomach cancer Mother 1067  . Colon cancer Neg Hx    Allergies  Allergen Reactions  . Soy Allergy Rash   Prior to Admission medications   Not on File     ROS: The patient denies fevers, chills, night sweats, unintentional weight loss, chest  pain, palpitations, wheezing, dyspnea on exertion, nausea, vomiting, abdominal pain, dysuria, hematuria, melena  All other systems have been reviewed and were otherwise negative with the exception of those mentioned in the HPI and as above.    PHYSICAL EXAM: Filed Vitals:   11/08/15 0858  BP: 140/98  Pulse: 72  Temp: 98.6 F (37 C)  Resp: 14   Body mass index is 23.28 kg/(m^2).   General: Alert, no acute distress HEENT:  Normocephalic, atraumatic, oropharynx patent. EOMI, PERRLA Cardiovascular:  Regular rate and rhythm, no rubs murmurs or gallops.  No Carotid bruits, radial pulse intact. No pedal edema.  Respiratory: Clear to auscultation bilaterally.  No wheezes, rales, or rhonchi.  No cyanosis, no use of accessory musculature Abdominal: No organomegaly, abdomen is soft and non-tender, positive bowel sounds. No masses. Skin: + paronychia left thumb lateral borders , no e.o felon at this time.  Neurologic: Facial musculature symmetric. Psychiatric: Patient acts appropriately throughout our interaction. Lymphatic: No cervical or submandibular lymphadenopathy Musculoskeletal: Gait intact.  Left thumb --No edema, tenderness\Neurovascualr intact, senstation intact, Decrease flexion, 5/5 strength   LABS:    EKG/XRAY:   Primary read interpreted by Dr.   at [facility].   ASSESSMENT/PLAN: Encounter Diagnoses  Name Primary?  . Paronychia, left Yes  . Thumb pain, left   . Thumb swelling    Wound care adn fu as dorected Wound cx pending Rx Keflex TID x 10 days Ibuprofen/tylenol prn pain  Fu as directed    Gross sideeffects, risk and benefits, and alternatives of medications d/w patient. Patient is aware that all medications have potential sideeffects and we are unable to predict every sideeffect or drug-drug interaction that may occur.    DO  11/08/2015 10:29 AM

## 2015-11-08 NOTE — Patient Instructions (Signed)
Cephalexin tablets or capsules What is this medicine? CEPHALEXIN (sef a LEX in) is a cephalosporin antibiotic. It is used to treat certain kinds of bacterial infections It will not work for colds, flu, or other viral infections. This medicine may be used for other purposes; ask your health care provider or pharmacist if you have questions. What should I tell my health care provider before I take this medicine? They need to know if you have any of these conditions: -kidney disease -stomach or intestine problems, especially colitis -an unusual or allergic reaction to cephalexin, other cephalosporins, penicillins, other antibiotics, medicines, foods, dyes or preservatives -pregnant or trying to get pregnant -breast-feeding How should I use this medicine? Take this medicine by mouth with a full glass of water. Follow the directions on the prescription label. This medicine can be taken with or without food. Take your medicine at regular intervals. Do not take your medicine more often than directed. Take all of your medicine as directed even if you think you are better. Do not skip doses or stop your medicine early. Talk to your pediatrician regarding the use of this medicine in children. While this drug may be prescribed for selected conditions, precautions do apply. Overdosage: If you think you have taken too much of this medicine contact a poison control center or emergency room at once. NOTE: This medicine is only for you. Do not share this medicine with others. What if I miss a dose? If you miss a dose, take it as soon as you can. If it is almost time for your next dose, take only that dose. Do not take double or extra doses. There should be at least 4 to 6 hours between doses. What may interact with this medicine? -probenecid -some other antibiotics This list may not describe all possible interactions. Give your health care provider a list of all the medicines, herbs, non-prescription drugs, or  dietary supplements you use. Also tell them if you smoke, drink alcohol, or use illegal drugs. Some items may interact with your medicine. What should I watch for while using this medicine? Tell your doctor or health care professional if your symptoms do not begin to improve in a few days. Do not treat diarrhea with over the counter products. Contact your doctor if you have diarrhea that lasts more than 2 days or if it is severe and watery. If you have diabetes, you may get a false-positive result for sugar in your urine. Check with your doctor or health care professional. What side effects may I notice from receiving this medicine? Side effects that you should report to your doctor or health care professional as soon as possible: -allergic reactions like skin rash, itching or hives, swelling of the face, lips, or tongue -breathing problems -pain or trouble passing urine -redness, blistering, peeling or loosening of the skin, including inside the mouth -severe or watery diarrhea -unusually weak or tired -yellowing of the eyes, skin Side effects that usually do not require medical attention (report to your doctor or health care professional if they continue or are bothersome): -gas or heartburn -genital or anal irritation -headache -joint or muscle pain -nausea, vomiting This list may not describe all possible side effects. Call your doctor for medical advice about side effects. You may report side effects to FDA at 1-800-FDA-1088. Where should I keep my medicine? Keep out of the reach of children. Store at room temperature between 59 and 86 degrees F (15 and 30 degrees C). Throw away any unused  medicine after the expiration date. NOTE: This sheet is a summary. It may not cover all possible information. If you have questions about this medicine, talk to your doctor, pharmacist, or health care provider.    2016, Elsevier/Gold Standard. (2008-01-30 17:09:13) Paronychia Paronychia is an  infection of the skin that surrounds a nail. It usually affects the skin around a fingernail, but it may also occur near a toenail. It often causes pain and swelling around the nail. This condition may come on suddenly or develop over a longer period. In some cases, a collection of pus (abscess) can form near or under the nail. Usually, paronychia is not serious and it clears up with treatment. CAUSES This condition may be caused by bacteria or fungi. It is commonly caused by either Streptococcus or Staphylococcus bacteria. The bacteria or fungi often cause the infection by getting into the affected area through an opening in the skin, such as a cut or a hangnail. RISK FACTORS This condition is more likely to develop in:  People who get their hands wet often, such as those who work as Fish farm managerdishwashers, bartenders, or nurses.  People who bite their fingernails or suck their thumbs.  People who trim their nails too short.  People who have hangnails or injured fingertips.  People who get manicures.  People who have diabetes. SYMPTOMS Symptoms of this condition include:  Redness and swelling of the skin near the nail.  Tenderness around the nail when you touch the area.  Pus-filled bumps under the cuticle. The cuticle is the skin at the base or sides of the nail.  Fluid or pus under the nail.  Throbbing pain in the area. DIAGNOSIS This condition is usually diagnosed with a physical exam. In some cases, a sample of pus may be taken from an abscess to be tested in a lab. This can help to determine what type of bacteria or fungi is causing the condition. TREATMENT Treatment for this condition depends on the cause and severity of the condition. If the condition is mild, it may clear up on its own in a few days. Your health care provider may recommend soaking the affected area in warm water a few times a day. When treatment is needed, the options may include:  Antibiotic medicine, if the  condition is caused by a bacterial infection.  Antifungal medicine, if the condition is caused by a fungal infection.  Incision and drainage, if an abscess is present. In this procedure, the health care provider will cut open the abscess so the pus can drain out. HOME CARE INSTRUCTIONS  Soak the affected area in warm water if directed to do so by your health care provider. You may be told to do this for 20 minutes, 2-3 times a day. Keep the area dry in between soakings.  Take medicines only as directed by your health care provider.  If you were prescribed an antibiotic medicine, finish all of it even if you start to feel better.  Keep the affected area clean.  Do not try to drain a fluid-filled bump yourself.  If you will be washing dishes or performing other tasks that require your hands to get wet, wear rubber gloves. You should also wear gloves if your hands might come in contact with irritating substances, such as cleaners or chemicals.  Follow your health care provider's instructions about:  Wound care.  Bandage (dressing) changes and removal. SEEK MEDICAL CARE IF:  Your symptoms get worse or do not improve with treatment.  You have a fever or chills.  You have redness spreading from the affected area.  You have continued or increased fluid, blood, or pus coming from the affected area.  Your finger or knuckle becomes swollen or is difficult to move.   This information is not intended to replace advice given to you by your health care provider. Make sure you discuss any questions you have with your health care provider.   Document Released: 04/21/2001 Document Revised: 03/12/2015 Document Reviewed: 10/03/2014 Elsevier Interactive Patient Education [employer]

## 2015-11-10 LAB — WOUND CULTURE: Gram Stain: NONE SEEN

## 2015-11-14 ENCOUNTER — Ambulatory Visit
Admission: RE | Admit: 2015-11-14 | Discharge: 2015-11-14 | Disposition: A | Discharge status: Home / Self Care | Payer: Managed Care, Other (non HMO) | Source: Ambulatory Visit

## 2015-11-14 DIAGNOSIS — Z1231 Encounter for screening mammogram for malignant neoplasm of breast: Secondary | ICD-10-CM

## 2016-09-14 ENCOUNTER — Encounter: Payer: Self-pay | Admitting: Family Medicine

## 2016-09-14 ENCOUNTER — Ambulatory Visit (INDEPENDENT_AMBULATORY_CARE_PROVIDER_SITE_OTHER): Payer: Managed Care, Other (non HMO) | Admitting: Family Medicine

## 2016-09-14 VITALS — BP 128/80 | HR 82 | Temp 98.3°F | Resp 12 | Ht 67.75 in | Wt 142.5 lb

## 2016-09-14 DIAGNOSIS — R17 Unspecified jaundice: Secondary | ICD-10-CM | POA: Diagnosis not present

## 2016-09-14 DIAGNOSIS — R58 Hemorrhage, not elsewhere classified: Secondary | ICD-10-CM

## 2016-09-14 LAB — CBC WITH DIFFERENTIAL/PLATELET
BASOS ABS: 0.1 10*3/uL (ref 0.0–0.1)
Basophils Relative: 1 % (ref 0.0–3.0)
Eosinophils Absolute: 0.2 10*3/uL (ref 0.0–0.7)
Eosinophils Relative: 3.1 % (ref 0.0–5.0)
HCT: 39.9 % (ref 36.0–46.0)
Hemoglobin: 13.6 g/dL (ref 12.0–15.0)
LYMPHS ABS: 1.2 10*3/uL (ref 0.7–4.0)
Lymphocytes Relative: 23.1 % (ref 12.0–46.0)
MCHC: 34.2 g/dL (ref 30.0–36.0)
MCV: 89.9 fl (ref 78.0–100.0)
MONO ABS: 0.4 10*3/uL (ref 0.1–1.0)
MONOS PCT: 7.5 % (ref 3.0–12.0)
NEUTROS ABS: 3.5 10*3/uL (ref 1.4–7.7)
NEUTROS PCT: 65.3 % (ref 43.0–77.0)
PLATELETS: 191 10*3/uL (ref 150.0–400.0)
RBC: 4.44 Mil/uL (ref 3.87–5.11)
RDW: 12.3 % (ref 11.5–15.5)
WBC: 5.4 10*3/uL (ref 4.0–10.5)

## 2016-09-14 LAB — HEPATIC FUNCTION PANEL
ALK PHOS: 66 U/L (ref 39–117)
ALT: 31 U/L (ref 0–35)
AST: 34 U/L (ref 0–37)
Albumin: 4.3 g/dL (ref 3.5–5.2)
BILIRUBIN TOTAL: 0.6 mg/dL (ref 0.2–1.2)
Bilirubin, Direct: 0.1 mg/dL (ref 0.0–0.3)
Total Protein: 7.2 g/dL (ref 6.0–8.3)

## 2016-09-14 NOTE — Progress Notes (Signed)
Pre visit review using our clinic review tool, if applicable. No additional management support is needed unless otherwise documented below in the visit note. 

## 2016-09-14 NOTE — Progress Notes (Signed)
HPI:   [name] is a 55 y.o. female, who is here today to establish care with me.  Former PCP: Dr Holly Collins  Last preventive routine visit: 2015  She follows a healthy diet and exercises regularly, she participates in body building competitions.   Concerns today: Rash  About 2 months ago she noted "spots" on her mid back, no tender or pruritis, states that lesions look like bruising. She denies any trauma.  She has not noted chills, fever, fatigue, epistaxis,gum bleeding, gross hematuria, blood in stool,or abnormal wt loss.   She denies any new medication, detergent, soap, or body product. No known insect bite or outdoor exposures to plants.  She mentions that around the time she noted lesions she was training harder and wearing a waist brace to protect her back from injury, she was also taking OTC NSAID's for muscle aching after exercising. She is still wearing waist brace but changed for a shorter one.  No sick contact. No Hx of eczema or similar rash in the past.  OTC medication for this problem: None   She denies oral lesions/edema,cough, wheezing, dyspnea, abdominal pain, nausea, or vomiting. She denies any history of sickle cell disease/trait or any other hematologic disorder.   Lab Results  Component Value Date   ALT 24 11/14/2014   AST 31 11/14/2014   ALKPHOS 67 11/14/2014   BILITOT 0.9 11/14/2014   Lab Results  Component Value Date   WBC 5.7 11/14/2014   HGB 13.6 11/14/2014   HCT 41.0 11/14/2014   MCV 89.8 11/14/2014   PLT 204.0 11/14/2014   Lesions are slowly fading.     Review of Systems  Constitutional: Negative for activity change, appetite change, fatigue, fever and unexpected weight change.  HENT: Negative for mouth sores, nosebleeds and trouble swallowing.   Eyes: Negative for redness and visual disturbance.  Respiratory: Negative for cough, shortness of breath and wheezing.   Cardiovascular: Negative for chest pain,  palpitations and leg swelling.  Gastrointestinal: Negative for abdominal pain, nausea and vomiting.       Negative for changes in bowel habits.  Genitourinary: Negative for decreased urine volume, difficulty urinating, dysuria and hematuria.  Skin: Positive for rash. Negative for wound.  Neurological: Negative for seizures, syncope, weakness, numbness and headaches.      No current outpatient prescriptions on file prior to visit.   No current facility-administered medications on file prior to visit.      Past Medical History:  Diagnosis Date  . Allergy 01/08/12   soy-face breaks in a rash  . Elevated blood pressure reading without diagnosis of hypertension    Allergies  Allergen Reactions  . Soy Allergy Rash    Family History  Problem Relation Age of Onset  . Diabetes Maternal Aunt   . Hypertension Maternal Aunt   . Cancer Maternal Grandmother     stomach  . Stroke Maternal Grandmother   . Stomach cancer Mother 5567  . Colon cancer Neg Hx     Social History   Social History  . Marital status: Married    Spouse name: N/A  . Number of children: N/A  . Years of education: N/A   Occupational History  . Personal trainer Other    Self employed.   Social History Main Topics  . Smoking status: Former Smoker    Quit date: 09/15/1989  . Smokeless tobacco: Never Used  . Alcohol use No  . Drug use: No  . Sexual activity:  Not Asked   Other Topics Concern  . None   Social History Narrative   [place]   She works in Clinical biochemistCustomer service   She was a social smoker but quit 22 years ago.   She has 1 son and 3 stepsons.    Vitals:   09/14/16 1306  BP: 128/80  Pulse: 82  Resp: 12  Temp: 98.3 F (36.8 C)    Body mass index is 21.83 kg/m.   Physical Exam  Nursing note and vitals reviewed. Constitutional: She is oriented to person, place, and time. She appears well-developed and well-nourished. No distress.  HENT:  Head:  Atraumatic.  Mouth/Throat: Oropharynx is clear and moist and mucous membranes are normal.  Eyes: Conjunctivae and EOM are normal. Pupils are equal, round, and reactive to light.  Neck: No tracheal deviation present. No thyroid mass and no thyromegaly present.  Cardiovascular: Normal rate and regular rhythm.   No murmur heard. Pulses:      Dorsalis pedis pulses are 2+ on the right side, and 2+ on the left side.  Respiratory: Effort normal and breath sounds normal. No respiratory distress.  GI: Soft. She exhibits no mass. There is no hepatomegaly. There is no tenderness.  Musculoskeletal: She exhibits no edema.  Lymphadenopathy:    She has no cervical adenopathy.       Right: No supraclavicular adenopathy present.       Left: No supraclavicular adenopathy present.  Neurological: She is alert and oriented to person, place, and time. She has normal strength. Coordination normal.  Skin: Skin is warm. Ecchymosis and rash noted. Rash is macular. Rash is not vesicular. No erythema.     Noted purple/ecchimotic like macular lesions on low thoracic and lumbar area. Lesions are not tender, some are confluent, defined borders. Also noted a few scattered on upper abdomen but lighter. Lesions are about 2-3 mm each one.  Psychiatric: She has a normal mood and affect.  Well groomed, good eye contact.      ASSESSMENT AND PLAN:     Holly Collins was seen today for establish care.  Diagnoses and all orders for this visit:  Ecchymosis -     CBC with Differential -     Hepatic function panel  Elevated bilirubin -     Hepatic function panel   Skin lesions certainly look like ecchymosis, and follow pattern of back/waist brace she reports wearing. Also the fact that she was straining harder and taking OTC NSAIDs can increase the risk of ecchymosis with minor trauma. History and clinical examination today do not suggest a serious hematologic process or illness. Today she had agreed with lab work, she will  stop wearing her brace, and stop NSAID's. Continue monitoring. If persistent or if she noted new lesions dermatology evaluation may be necessary. Clearly instructed about warning signs.  -Reviewing prior labs, in the past total bilirubin has been slightly elevated: 2 years ago 1.3 and 3 years ago 1.7. Asymptomatic. Further recommendations would be given according to lab results.   -Recommended arranging a routine preventive visit (CPE) and gyn preventive.     Ancelmo Hunt G. [place]az[place], MD  [facility]. [place] office.

## 2016-09-14 NOTE — Patient Instructions (Addendum)
A few things to remember from today's visit:   Ecchymosis - Plan: CBC with Differential, Hepatic function panel  Stop wearing waist support and stop over-the-counter ibuprofen, RBCs, or naproxen. Please remember to schedule your routine physical, mainly gynecologic preventive care.    Please be sure medication list is accurate. If a new problem present, please set up appointment sooner than planned today.

## 2016-11-10 ENCOUNTER — Other Ambulatory Visit: Payer: Self-pay | Admitting: Family Medicine

## 2016-11-10 DIAGNOSIS — Z1231 Encounter for screening mammogram for malignant neoplasm of breast: Secondary | ICD-10-CM

## 2016-12-07 ENCOUNTER — Ambulatory Visit
Admission: RE | Admit: 2016-12-07 | Discharge: 2016-12-07 | Disposition: A | Discharge status: Home / Self Care | Payer: Managed Care, Other (non HMO) | Source: Ambulatory Visit | Attending: Family Medicine | Admitting: Family Medicine

## 2016-12-07 DIAGNOSIS — Z1231 Encounter for screening mammogram for malignant neoplasm of breast: Secondary | ICD-10-CM

## 2017-07-29 ENCOUNTER — Encounter: Payer: Self-pay | Admitting: Family Medicine

## 2018-05-10 NOTE — Progress Notes (Signed)
HPI:   [name] is a 57 y.o. female, who is here today for her routine physical.  Last CPE: 11/2013  Regular exercise 3 or more time per week: She exercises daily. Following a healthy diet: yes.  She lives with her husband.  Chronic medical problems: HTN on non pharmacologic treatment.  Pap smear: 11/2012 HPV was not done. Hx of abnormal pap smears: Denies. Hx of STD's: Denies  M: 12-13 G: 5 P2 stillbirth x 1, L: 1 Miscarriages x 2. She breast-fed.   LMP 3-4 years ago.    Immunization History  Administered Date(s) Administered  . Tdap 11/23/2012    Mammogram: 11/2016 Bi-Rads 1.  She does not want to have mammogram this year. Colonoscopy: 01/2013, 10 years follow-up was recommended. DEXA: N/A  Hep C screening: Not done before.   She is complaining of bilateral hip pain, left is worse than right.  She states that"probably" has been going on for a year, started suddenly when she was stretching hips, she has had pain since then. Limitation of range of motion. Pain is exacerbated by abduction and walking. Alleviated by rest. She would like a referral to orthopedist.    Review of Systems  Constitutional: Negative for appetite change, fatigue, fever and unexpected weight change.  HENT: Negative for dental problem, hearing loss, nosebleeds, trouble swallowing and voice change.   Eyes: Negative for redness and visual disturbance.  Respiratory: Negative for cough, shortness of breath and wheezing.   Cardiovascular: Negative for chest pain, palpitations and leg swelling.  Gastrointestinal: Negative for abdominal pain, blood in stool, nausea and vomiting.       No changes in bowel habits.  Endocrine: Negative for cold intolerance, heat intolerance, polydipsia, polyphagia and polyuria.  Genitourinary: Negative for decreased urine volume, dysuria, hematuria, vaginal bleeding and vaginal discharge.       No breast tenderness or nipple discharge.    Musculoskeletal: Positive for arthralgias and gait problem.  Skin: Negative for rash and wound.  Allergic/Immunologic: Negative for environmental allergies.  Neurological: Negative for syncope, weakness, numbness and headaches.  Hematological: Negative for adenopathy. Does not bruise/bleed easily.  Psychiatric/Behavioral: Negative for confusion and sleep disturbance. The patient is not nervous/anxious.   All other systems reviewed and are negative.     No current outpatient medications on file prior to visit.   No current facility-administered medications on file prior to visit.      Past Medical History:  Diagnosis Date  . Allergy 01/08/12   soy-face breaks in a rash  . Elevated blood pressure reading without diagnosis of hypertension     Past Surgical History:  Procedure Laterality Date  . BREAST ENHANCEMENT SURGERY  2011    Allergies  Allergen Reactions  . Soy Allergy Rash    Family History  Problem Relation Age of Onset  . Diabetes Maternal Aunt   . Hypertension Maternal Aunt   . Cancer Maternal Grandmother        stomach  . Stroke Maternal Grandmother   . Stomach cancer Mother 63  . Colon cancer Neg Hx     Social History   Socioeconomic History  . Marital status: Married    Spouse name: Not on file  . Number of children: Not on file  . Years of education: Not on file  . Highest education level: Not on file  Occupational History  . Occupation: Market researcher: [employer]    Comment: Self employed.  Social Needs  .  Financial resource strain: Not on file  . Food insecurity:    Worry: Not on file    Inability: Not on file  . Transportation needs:    Medical: Not on file    Non-medical: Not on file  Tobacco Use  . Smoking status: Former Smoker    Last attempt to quit: 09/15/1989    Years since quitting: 28.6  . Smokeless tobacco: Never Used  Substance and Sexual Activity  . Alcohol use: No  . Drug use: No  . Sexual activity: Not on file   Lifestyle  . Physical activity:    Days per week: Not on file    Minutes per session: Not on file  . Stress: Not on file  Relationships  . Social connections:    Talks on phone: Not on file    Gets together: Not on file    Attends religious service: Not on file    Active member of club or organization: Not on file    Attends meetings of clubs or organizations: Not on file    Relationship status: Not on file  Other Topics Concern  . Not on file  Social History Narrative   Patient originally from [place] area   She works in Clinical biochemist   She was a social smoker but quit 22 years ago.   She has 1 son and 3 stepsons.     Vitals:   05/11/18 0939  BP: 122/80  Pulse: 60  Resp: 12  Temp: 97.9 F (36.6 C)  SpO2: 95%   Body mass index is 21.44 kg/m.   Wt Readings from Last 3 Encounters:  05/11/18 140 lb (63.5 kg)  09/14/16 142 lb 8 oz (64.6 kg)  11/08/15 152 lb (68.9 kg)    Physical Exam  Nursing note and vitals reviewed. Constitutional: She is oriented to person, place, and time. She appears well-developed and well-nourished. No distress.  HENT:  Head: Normocephalic and atraumatic.  Right Ear: Hearing, tympanic membrane, external ear and ear canal normal.  Left Ear: Hearing, tympanic membrane, external ear and ear canal normal.  Mouth/Throat: Uvula is midline, oropharynx is clear and moist and mucous membranes are normal.  Eyes: Pupils are equal, round, and reactive to light. Conjunctivae and EOM are normal.  Neck: No tracheal deviation present. No thyroid mass and no thyromegaly present.  Cardiovascular: Normal rate and regular rhythm.  No murmur heard. Pulses:      Dorsalis pedis pulses are 2+ on the right side, and 2+ on the left side.  Respiratory: Effort normal and breath sounds normal. No respiratory distress.  GI: Soft. She exhibits no mass. There is no hepatomegaly. There is no tenderness.  Genitourinary: There is no rash, tenderness or  lesion on the right labia. There is no rash, tenderness or lesion on the left labia. Uterus is not enlarged and not tender. Cervix exhibits no motion tenderness, no discharge and no friability. Right adnexum displays no mass, no tenderness and no fullness. Left adnexum displays no mass, no tenderness and no fullness. No tenderness or bleeding in the vagina. No vaginal discharge found.  Genitourinary Comments: Breast: Bilaterally breast implants, no masses, skin abnormalities, or nipple discharge bilaterally.  On cervix scattered punctuate erythematous rash.  No tenderness or other abnormality appreciated.  Pap smear was collected.  Musculoskeletal: She exhibits no edema.  No major deformity or signs of synovitis appreciated. Limitation of ROM hips, L>R, movement of left hip elicts pain. Antalgic gait.   Lymphadenopathy:  She has no cervical adenopathy.       Right: No inguinal and no supraclavicular adenopathy present.       Left: No inguinal and no supraclavicular adenopathy present.  Neurological: She is alert and oriented to person, place, and time. She has normal strength. No cranial nerve deficit. Gait normal.  Reflex Scores:      Bicep reflexes are 2+ on the right side and 2+ on the left side.      Patellar reflexes are 2+ on the right side and 2+ on the left side. Skin: Skin is warm. No rash noted. No erythema.  Psychiatric: She has a normal mood and affect.  Well groomed, good eye contact.      ASSESSMENT AND PLAN:  Ms. [name] was here today annual physical examination.  Orders Placed This Encounter  Procedures  . Basic metabolic panel  . Lipid panel  . Hemoglobin A1c  . Hepatitis C antibody screen  . CBC with Differential/Platelet  . Ambulatory referral to Orthopedic Surgery    Lab Results  Component Value Date   HGBA1C 5.4 05/11/2018   Lab Results  Component Value Date   CREATININE 0.61 05/11/2018   BUN 10 05/11/2018   NA 139 05/11/2018   K 3.6  05/11/2018   CL 100 05/11/2018   CO2 31 05/11/2018   Lab Results  Component Value Date   WBC 4.6 05/11/2018   HGB 13.5 05/11/2018   HCT 39.4 05/11/2018   MCV 89.2 05/11/2018   PLT 211.0 05/11/2018    Lab Results  Component Value Date   CHOL 153 05/11/2018   HDL 70.20 05/11/2018   LDLCALC 67 05/11/2018   TRIG 78.0 05/11/2018   CHOLHDL 2 05/11/2018    Encounter for general adult medical examination with abnormal findings  We discussed the importance of regular physical activity and healthy diet for prevention of chronic illness and/or complications. Preventive guidelines reviewed. Vaccination up to date. She is not interested in STD screening. Ca++ and vit D supplementation recommended. Next CPE in a year.  Encounter for HCV screening test for high risk patient -     Hepatitis C antibody screen  Screening for lipid disorders -     Lipid panel  Diabetes mellitus screening -     Basic metabolic panel -     Hemoglobin A1c  Cervical cancer screening -     Cytology - PAP ()  Pain of both hip joints -     Ambulatory referral to Orthopedic Surgery -     CBC with Differential/Platelet  Cervix abnormality  Punctuate rash, ? Chronic. No other abnormality. Denies risk factors for STD's,Hx of trauma. Asymptomatic.  I will wait for pap smear results.     Return in 1 year (on 05/12/2019) for CPE.      Renalda Locklin G. SwazilandJordan, MD  [facility]. [place] office.

## 2018-05-11 ENCOUNTER — Encounter: Payer: Self-pay | Admitting: Family Medicine

## 2018-05-11 ENCOUNTER — Ambulatory Visit (INDEPENDENT_AMBULATORY_CARE_PROVIDER_SITE_OTHER): Payer: 59 | Admitting: Family Medicine

## 2018-05-11 ENCOUNTER — Other Ambulatory Visit (HOSPITAL_COMMUNITY)
Admission: RE | Admit: 2018-05-11 | Discharge: 2018-05-11 | Disposition: A | Discharge status: Home / Self Care | Payer: 59 | Source: Ambulatory Visit | Attending: Family Medicine | Admitting: Family Medicine

## 2018-05-11 VITALS — BP 122/80 | HR 60 | Temp 97.9°F | Resp 12 | Ht 67.75 in | Wt 140.0 lb

## 2018-05-11 DIAGNOSIS — Z13220 Encounter for screening for lipoid disorders: Secondary | ICD-10-CM | POA: Diagnosis not present

## 2018-05-11 DIAGNOSIS — Z124 Encounter for screening for malignant neoplasm of cervix: Secondary | ICD-10-CM

## 2018-05-11 DIAGNOSIS — Z0001 Encounter for general adult medical examination with abnormal findings: Secondary | ICD-10-CM

## 2018-05-11 DIAGNOSIS — M25552 Pain in left hip: Secondary | ICD-10-CM | POA: Diagnosis not present

## 2018-05-11 DIAGNOSIS — M25551 Pain in right hip: Secondary | ICD-10-CM

## 2018-05-11 DIAGNOSIS — N889 Noninflammatory disorder of cervix uteri, unspecified: Secondary | ICD-10-CM

## 2018-05-11 DIAGNOSIS — Z1159 Encounter for screening for other viral diseases: Secondary | ICD-10-CM

## 2018-05-11 DIAGNOSIS — Z131 Encounter for screening for diabetes mellitus: Secondary | ICD-10-CM

## 2018-05-11 DIAGNOSIS — Z9189 Other specified personal risk factors, not elsewhere classified: Secondary | ICD-10-CM

## 2018-05-11 LAB — LIPID PANEL
Cholesterol: 153 mg/dL (ref 0–200)
HDL: 70.2 mg/dL (ref >39.00)
LDL CALC: 67 mg/dL (ref 0–99)
NONHDL: 82.9
Total CHOL/HDL Ratio: 2
Triglycerides: 78 mg/dL (ref 0.0–149.0)
VLDL: 15.6 mg/dL (ref 0.0–40.0)

## 2018-05-11 LAB — CBC WITH DIFFERENTIAL/PLATELET
BASOS ABS: 0.1 10*3/uL (ref 0.0–0.1)
Basophils Relative: 1.3 % (ref 0.0–3.0)
Eosinophils Absolute: 0.1 10*3/uL (ref 0.0–0.7)
Eosinophils Relative: 1.8 % (ref 0.0–5.0)
HCT: 39.4 % (ref 36.0–46.0)
Hemoglobin: 13.5 g/dL (ref 12.0–15.0)
LYMPHS ABS: 1.2 10*3/uL (ref 0.7–4.0)
Lymphocytes Relative: 26.6 % (ref 12.0–46.0)
MCHC: 34.3 g/dL (ref 30.0–36.0)
MCV: 89.2 fl (ref 78.0–100.0)
Monocytes Absolute: 0.3 10*3/uL (ref 0.1–1.0)
Monocytes Relative: 6.3 % (ref 3.0–12.0)
NEUTROS ABS: 2.9 10*3/uL (ref 1.4–7.7)
NEUTROS PCT: 64 % (ref 43.0–77.0)
PLATELETS: 211 10*3/uL (ref 150.0–400.0)
RBC: 4.42 Mil/uL (ref 3.87–5.11)
RDW: 12.6 % (ref 11.5–15.5)
WBC: 4.6 10*3/uL (ref 4.0–10.5)

## 2018-05-11 LAB — BASIC METABOLIC PANEL
BUN: 10 mg/dL (ref 6–23)
CALCIUM: 9.5 mg/dL (ref 8.4–10.5)
CO2: 31 meq/L (ref 19–32)
CREATININE: 0.61 mg/dL (ref 0.40–1.20)
Chloride: 100 mEq/L (ref 96–112)
GFR: 130.18 mL/min (ref >60.00)
Glucose, Bld: 83 mg/dL (ref 70–99)
Potassium: 3.6 mEq/L (ref 3.5–5.1)
Sodium: 139 mEq/L (ref 135–145)

## 2018-05-11 LAB — HEMOGLOBIN A1C: HEMOGLOBIN A1C: 5.4 % (ref 4.6–6.5)

## 2018-05-11 NOTE — Patient Instructions (Signed)
A few things to remember from today's visit:   Encounter for general adult medical examination with abnormal findings  Encounter for HCV screening test for high risk patient - Plan: Hepatitis C antibody screen  Screening for lipid disorders - Plan: Lipid panel  Diabetes mellitus screening - Plan: Basic metabolic panel, Hemoglobin A1c  Cervical cancer screening - Plan: Cytology - PAP ([facility])  Pain of both hip joints - Plan: Ambulatory referral to Orthopedic Surgery  Today you have you routine preventive visit.  At least 150 minutes of moderate exercise per week, daily brisk walking for 15-30 min is a good exercise option. Healthy diet low in saturated (animal) fats and sweets and consisting of fresh fruits and vegetables, lean meats such as fish and white chicken and whole grains.  These are some of recommendations for screening depending of age and risk factors:   - Vaccines:  Tdap vaccine every 10 years.  Shingles vaccine recommended at age 57, could be given after 57 years of age but not sure about insurance coverage.   Pneumonia vaccines:  Prevnar 13 at 65 and Pneumovax at 66. Sometimes Pneumovax is giving earlier if history of smoking, lung disease,diabetes,kidney disease among some.    Screening for diabetes at age 57 and every 3 years.  Cervical cancer prevention:  Pap smear starts at 57 years of age and continues periodically until 57 years old in low risk women. Pap smear every 3 years between 3921 and 57 years old. Pap smear every 3-5 years between women 30 and older if pap smear negative and HPV screening negative.   -Breast cancer: Mammogram: There is disagreement between experts about when to start screening in low risk asymptomatic female but recent recommendations are to start screening at 4640 and not later than 57 years old , every 1-2 years and after 57 yo q 2 years. Screening is recommended until 57 years old but some women can continue screening depending  of healthy issues.   Colon cancer screening: starts at 57 years old until 57 years old.  Cholesterol disorder screening at age 57 and every 3 years.  Also recommended:  1. Dental visit- Brush and floss your teeth twice daily; visit your dentist twice a year. 2. Eye doctor- Get an eye exam at least every 2 years. 3. Helmet use- Always wear a helmet when riding a bicycle, motorcycle, rollerblading or skateboarding. 4. Safe sex- If you may be exposed to sexually transmitted infections, use a condom. 5. Seat belts- Seat belts can save your live; always wear one. 6. Smoke/Carbon Monoxide detectors- These detectors need to be installed on the appropriate level of your home. Replace batteries at least once a year. 7. Skin cancer- When out in the sun please cover up and use sunscreen 15 SPF or higher. 8. Violence- If anyone is threatening or hurting you, please tell your healthcare provider.  9. Drink alcohol in moderation- Limit alcohol intake to one drink or less per day. Never drink and drive.  Please be sure medication list is accurate. If a new problem present, please set up appointment sooner than planned today.

## 2018-05-12 ENCOUNTER — Encounter: Payer: Self-pay | Admitting: Family Medicine

## 2018-05-12 LAB — HEPATITIS C ANTIBODY
Hepatitis C Ab: NONREACTIVE
SIGNAL TO CUT-OFF: 0.02 (ref <1.00)

## 2018-05-13 ENCOUNTER — Encounter: Payer: Self-pay | Admitting: Family Medicine

## 2018-05-13 LAB — CYTOLOGY - PAP
Diagnosis: NEGATIVE
HPV: NOT DETECTED

## 2018-05-18 ENCOUNTER — Ambulatory Visit (INDEPENDENT_AMBULATORY_CARE_PROVIDER_SITE_OTHER): Payer: Self-pay | Admitting: Orthopaedic Surgery

## 2018-05-19 ENCOUNTER — Ambulatory Visit (INDEPENDENT_AMBULATORY_CARE_PROVIDER_SITE_OTHER): Payer: 59 | Admitting: Orthopaedic Surgery

## 2018-05-19 ENCOUNTER — Ambulatory Visit (INDEPENDENT_AMBULATORY_CARE_PROVIDER_SITE_OTHER): Payer: Self-pay

## 2018-05-19 ENCOUNTER — Ambulatory Visit (INDEPENDENT_AMBULATORY_CARE_PROVIDER_SITE_OTHER): Payer: 59

## 2018-05-19 ENCOUNTER — Encounter (INDEPENDENT_AMBULATORY_CARE_PROVIDER_SITE_OTHER): Payer: Self-pay | Admitting: Orthopaedic Surgery

## 2018-05-19 DIAGNOSIS — M25551 Pain in right hip: Secondary | ICD-10-CM | POA: Diagnosis not present

## 2018-05-19 DIAGNOSIS — M25552 Pain in left hip: Secondary | ICD-10-CM

## 2018-05-19 DIAGNOSIS — M1612 Unilateral primary osteoarthritis, left hip: Secondary | ICD-10-CM | POA: Diagnosis not present

## 2018-05-19 DIAGNOSIS — M1611 Unilateral primary osteoarthritis, right hip: Secondary | ICD-10-CM

## 2018-05-19 NOTE — Progress Notes (Signed)
Office Visit Note   Patient: Holly Collins           Date of Birth: 07-27-1961           MRN: [id number] Visit Date: 05/19/2018              Requested by: [place], Betty G, MD [address] [place], [place] [address] PCP: [place], Betty G, MD   Assessment & Plan: Visit Diagnoses:  1. Pain in right hip   2. Pain in left hip   3. Unilateral primary osteoarthritis, left hip   4. Unilateral primary osteoarthritis, right hip     Plan: I went over her x-rays with her in detail.  She understands that there is really no other treatment options.  She has no joint space left to provide steroid injections in her hips.  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  Her arthritis is incredibly severe.  She is walking with a Trendelenburg gait at this point.  I showed her hip x-rays and went over hip models in general.  We talked about hip replacement surgery through direct anterior approach and how this could improve her mobility decrease her pain and improve from quality of life.  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  All question concerns were answered and addressed.  She will call us if she decides to have the surgery.  Follow-Up Instructions: Return if symptoms worsen or fail to improve.   Orders:  Orders Placed This Encounter  Procedures  . XR HIP UNILAT W OR W/O PELVIS 2-3 VIEWS RIGHT  . XR HIP UNILAT W OR W/O PELVIS 2-3 VIEWS LEFT   No orders of the defined types were placed in this encounter.     Procedures: No procedures performed   Clinical Data: No additional findings.   Subjective: No chief complaint on file. Patient is a very pleasant 57 year old athletic female who comes in with chief complaint of  bilateral hip pain.  Her hip pain has been worsening for well over a year to 2 years now.  She hurts in the groin on both sides.  She has severe limitations in rotation of both hips.  She is someone who is very thin and active.  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  At this point she is walking with a significant limp to her gait.  She has trouble crossing her legs and putting her shoes and socks on bilaterally.  She is tried holistic approach to healing in terms of medications that help with decreasing inflammation.  Being a Systems analyst, she is worked on hip strengthening exercises and quad strengthening exercises as well as back exercises for years to try to strengthen her hips.  She is seeking further evaluation and treatment of her hip pain at this point.  Now her hip pain is detrimentally affected her work as a Systems analyst.  Her hip pain is detrimental effect directives daily living, and her quality of life.  She is incredibly healthy individual and is not a diabetic and not a smoker.  HPI  Review of Systems She currently denies any headache, chest pain, shortness of breath, fever, chills, nausea, vomiting.  Objective: Vital  Signs: LMP 08/10/2011   Physical Exam She is alert and oriented x3 and in no acute distress Ortho Exam On examination she is very thin individual but has strong muscles of both upper and lower extremities.  She has significant limitations of any internal and external rotation of both hips with severe pain in the groin with attempts at rotation.  She has good flexion of both hips.  Her leg lengths are equal. Specialty Comments:  No specialty comments available.  Imaging: Xr Hip Unilat W Or W/o Pelvis 2-3 Views Left  Result Date: 05/19/2018 An AP pelvis and lateral left hip show severe end-stage arthritis of the left hip.  There is significant joint space narrowing with para-articular osteophytes and sclerotic changes  in both the femoral head and acetabulum.  Xr Hip Unilat W Or W/o Pelvis 2-3 Views Right  Result Date: 05/19/2018 An AP pelvis and the lateral of the right hip show severe end-stage arthritis of the right hip.  There is essentially almost no joint space remaining.  There is significant para-articular osteophytes all around the femoral head and acetabulum.  There is sclerotic changes as well.  There is some cystic changes in the femoral head and acetabulum.    PMFS History: Patient Active Problem List   Diagnosis Date Noted  . Unilateral primary osteoarthritis, left hip 05/19/2018  . Unilateral primary osteoarthritis, right hip 05/19/2018  . Hypertension 11/23/2012  . Preventative health care 09/16/2011   Past Medical History:  Diagnosis Date  . Allergy 01/08/12   soy-face breaks in a rash  . Elevated blood pressure reading without diagnosis of hypertension     Family History  Problem Relation Age of Onset  . Diabetes Maternal Aunt   . Hypertension Maternal Aunt   . Cancer Maternal Grandmother        stomach  . Stroke Maternal Grandmother   . Stomach cancer Mother [id number]  . Colon cancer Neg Hx     Past Surgical History:  Procedure Laterality Date  . BREAST ENHANCEMENT SURGERY  2011   Social History   Occupational History  . Occupation: Market researcherersonal trainer    Employer: OTHER    Comment: Self employed.  Tobacco Use  . Smoking status: Former Smoker    Last attempt to quit: 09/15/1989    Years since quitting: 28.6  . Smokeless tobacco: Never Used  Substance and Sexual Activity  . Alcohol use: No  . Drug use: No  . Sexual activity: Not on file

## 2023-03-09 ENCOUNTER — Encounter: Payer: Self-pay | Admitting: Internal Medicine
